# Patient Record
Sex: Female | Born: 2000
Health system: Southern US, Community
[De-identification: ages and names within clinical notes are randomized; demographics above are authoritative.]

## PROBLEM LIST (undated history)

## (undated) DIAGNOSIS — J45909 Unspecified asthma, uncomplicated: Secondary | ICD-10-CM

## (undated) HISTORY — PX: NO PAST SURGERIES: SHX2092

---
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
## 2021-05-11 ENCOUNTER — Other Ambulatory Visit: Payer: Self-pay

## 2021-05-11 ENCOUNTER — Ambulatory Visit
Admission: EM | Admit: 2021-05-11 | Discharge: 2021-05-11 | Disposition: A | Discharge status: Home / Self Care | Payer: Federal, State, Local not specified - PPO | Attending: Physician Assistant | Admitting: Physician Assistant

## 2021-05-11 ENCOUNTER — Encounter: Payer: Self-pay | Admitting: Emergency Medicine

## 2021-05-11 DIAGNOSIS — J069 Acute upper respiratory infection, unspecified: Secondary | ICD-10-CM | POA: Diagnosis present

## 2021-05-11 DIAGNOSIS — Z20822 Contact with and (suspected) exposure to covid-19: Secondary | ICD-10-CM | POA: Diagnosis not present

## 2021-05-11 MED ORDER — PROMETHAZINE-DM 6.25-15 MG/5ML PO SYRP: 5.0000 mL | ORAL_SOLUTION | Freq: Four times a day (QID) | ORAL | 0 refills | Status: AC | PRN

## 2021-05-11 NOTE — Discharge Instructions (Signed)

## 2021-05-11 NOTE — ED Triage Notes (Signed)
Pt presents today with dry cough x 5 days, denies fever.

## 2021-05-11 NOTE — ED Provider Notes (Signed)
[facility]    CSN: [id number] Arrival date & time: 05/11/21  1527      History   Chief Complaint Chief Complaint  Patient presents with   Cough    HPI Madison Hale is a 20 y.o. female presenting for 3 to 4-day history of cough and congestion.  Patient reports that her nasal congestion has improved.  Admits to scratchy throat.  Patient has history of asthma but denies any wheezing or breathing difficulty.  Notes mild chest tightness.  No associated fevers, fatigue or body aches.  No sick contacts or known exposure to influenza or COVID-19.  Patient has taken allergy medication and over-the-counter Mucinex.  Reports no improvement in symptoms.  Has not needed to use her asthma inhaler.  No other complaints.  HPI  Past Medical History:  Diagnosis Date   Asthma     There are no problems to display for this patient.   Past Surgical History:  Procedure Laterality Date   NO PAST SURGERIES      OB History   No obstetric history on file.      Home Medications    Prior to Admission medications   Medication Sig Start Date End Date Taking? Authorizing Provider  promethazine-dextromethorphan (PROMETHAZINE-DM) 6.25-15 MG/5ML syrup Take 5 mLs by mouth 4 (four) times daily as needed for cough. 05/11/21  Yes Eusebio Friendly B, PA-C  albuterol (PROVENTIL HFA;VENTOLIN HFA) 108 (90 BASE) MCG/ACT inhaler Inhale into the lungs every 6 (six) hours as needed for wheezing or shortness of breath.    [provider]  Dexmethylphenidate HCl (FOCALIN XR) 25 MG CP24 Take 25 mg by mouth daily.    [provider]  etonogestrel (NEXPLANON) 68 MG IMPL implant 68 mg by Subdermal route once.    [provider]  fluticasone-salmeterol (ADVAIR HFA) 115-21 MCG/ACT inhaler Inhale 2 puffs into the lungs 2 (two) times daily.    [provider]  montelukast (SINGULAIR) 10 MG tablet Take 10 mg by mouth at bedtime.    [provider]  traZODone  (DESYREL) 50 MG tablet Take 25 mg by mouth at bedtime as needed for sleep.    [provider]    Family History Family History  Adopted: Yes  Family history unknown: Yes    Social History Social History   Tobacco Use   Smoking status: Never   Smokeless tobacco: Never  Vaping Use   Vaping Use: Never used  Substance Use Topics   Alcohol use: No   Drug use: No     Allergies   Patient has no known allergies.   Review of Systems Review of Systems  Constitutional:  Negative for chills, diaphoresis, fatigue and fever.  HENT:  Positive for congestion. Negative for ear pain, rhinorrhea, sinus pressure, sinus pain and sore throat.   Respiratory:  Positive for cough and chest tightness. Negative for shortness of breath.   Cardiovascular:  Negative for chest pain.  Gastrointestinal:  Negative for abdominal pain, nausea and vomiting.  Musculoskeletal:  Negative for arthralgias and myalgias.  Skin:  Negative for rash.  Neurological:  Negative for weakness and headaches.  Hematological:  Negative for adenopathy.    Physical Exam Triage Vital Signs ED Triage Vitals  Enc Vitals Group     BP 05/11/21 1640 (!) 98/58     Pulse Rate 05/11/21 1640 78     Resp 05/11/21 1640 18     Temp 05/11/21 1640 98.6 F (37 C)  Temp Source 05/11/21 1640 Oral     SpO2 05/11/21 1640 100 %     Weight --      Height --      Head Circumference --      Peak Flow --      Pain Score 05/11/21 1638 0     Pain Loc --      Pain Edu? --      Excl. in GC? --    No data found.  Updated Vital Signs BP (!) 98/58 (BP Location: Right Arm)   Pulse 78   Temp 98.6 F (37 C) (Oral)   Resp 18   LMP 04/11/2021 (Exact Date)   SpO2 100%      Physical Exam Vitals and nursing note reviewed.  Constitutional:      General: She is not in acute distress.    Appearance: Normal appearance. She is not ill-appearing or toxic-appearing.  HENT:     Head: Normocephalic and atraumatic.     Nose:  Congestion present.     Mouth/Throat:     Mouth: Mucous membranes are moist.     Pharynx: Oropharynx is clear.  Eyes:     General: No scleral icterus.       Right eye: No discharge.        Left eye: No discharge.     Conjunctiva/sclera: Conjunctivae normal.  Cardiovascular:     Rate and Rhythm: Normal rate and regular rhythm.     Heart sounds: Normal heart sounds.  Pulmonary:     Effort: Pulmonary effort is normal. No respiratory distress.     Breath sounds: Normal breath sounds.  Musculoskeletal:     Cervical back: Neck supple.  Skin:    General: Skin is dry.  Neurological:     General: No focal deficit present.     Mental Status: She is alert. Mental status is at baseline.     Motor: No weakness.     Gait: Gait normal.  Psychiatric:        Mood and Affect: Mood normal.        Behavior: Behavior normal.        Thought Content: Thought content normal.     UC Treatments / Results  Labs (all labs ordered are listed, but only abnormal results are displayed) Labs Reviewed  SARS CORONAVIRUS 2 (TAT 6-24 HRS)    EKG   Radiology No results found.  Procedures Procedures (including critical care time)  Medications Ordered in UC Medications - No data to display  Initial Impression / Assessment and Plan / UC Course  I have reviewed the triage vital signs and the nursing notes.  Pertinent labs & imaging results that were available during my care of the patient were reviewed by me and considered in my medical decision making (see chart for details).   20 year old female presenting for 3 to 4-day history of cough and congestion.  Home COVID test negative today.  No associated fevers or breathing difficulty but admits to mild chest tightness.  History of asthma.  Has not needed to use inhaler.  Vitals are stable and she is overall well-appearing.  Exam significant for nasal congestion only.  Chest clear to auscultation heart regular rate and rhythm.  PCR COVID test  obtained.  Current CDC guidelines, isolation protocol and ED precautions reviewed if positive COVID.  Supportive care advised increasing rest of fluids.  Sent Promethazine DM to pharmacy.  Advised symptoms consistent with viral illness.  Advised using albuterol  if any shortness of breath.  Reviewed return ED precautions.  Work note given.   Final Clinical Impressions(s) / UC Diagnoses   Final diagnoses:  Viral URI with cough     Discharge Instructions      URI/COLD SYMPTOMS: Your exam today is consistent with a viral illness. Antibiotics are not indicated at this time. Use medications as directed, including cough syrup, nasal saline, and decongestants. Your symptoms should improve over the next few days and resolve within 7-10 days. Increase rest and fluids. F/u if symptoms worsen or predominate such as sore throat, ear pain, productive cough, shortness of breath, or if you develop high fevers or worsening fatigue over the next several days.    You have received COVID testing today either for positive exposure, concerning symptoms that could be related to COVID infection, screening purposes, or re-testing after confirmed positive.  Your test obtained today checks for active viral infection in the last 1-2 weeks. If your test is negative now, you can still test positive later. So, if you do develop symptoms you should either get re-tested and/or isolate x 5 days and then strict mask use x 5 days (unvaccinated) or mask use x 10 days (vaccinated). Please follow CDC guidelines.  While Rapid antigen tests come back in 15-20 minutes, send out PCR/molecular test results typically come back within 1-3 days. In the mean time, if you are symptomatic, assume this could be a positive test and treat/monitor yourself as if you do have COVID.   We will call with test results if positive. Please download the MyChart app and set up a profile to access test results.   If symptomatic, go home and rest. Push  fluids. Take Tylenol as needed for discomfort. Gargle warm salt water. Throat lozenges. Take Mucinex DM or Robitussin for cough. Humidifier in bedroom to ease coughing. Warm showers. Also review the COVID handout for more information.  COVID-19 INFECTION: The incubation period of COVID-19 is approximately 14 days after exposure, with most symptoms developing in roughly 4-5 days. Symptoms may range in severity from mild to critically severe. Roughly 80% of those infected will have mild symptoms. People of any age may become infected with COVID-19 and have the ability to transmit the virus. The most common symptoms include: fever, fatigue, cough, body aches, headaches, sore throat, nasal congestion, shortness of breath, nausea, vomiting, diarrhea, changes in smell and/or taste.    COURSE OF ILLNESS Some patients may begin with mild disease which can progress quickly into critical symptoms. If your symptoms are worsening please call ahead to the Emergency Department and proceed there for further treatment. Recovery time appears to be roughly 1-2 weeks for mild symptoms and 3-6 weeks for severe disease.   GO IMMEDIATELY TO ER FOR FEVER YOU ARE UNABLE TO GET DOWN WITH TYLENOL, BREATHING PROBLEMS, CHEST PAIN, FATIGUE, LETHARGY, INABILITY TO EAT OR DRINK, ETC  QUARANTINE AND ISOLATION: To help decrease the spread of COVID-19 please remain isolated if you have COVID infection or are highly suspected to have COVID infection. This means -stay home and isolate to one room in the home if you live with others. Do not share a bed or bathroom with others while ill, sanitize and wipe down all countertops and keep common areas clean and disinfected. Stay home for 5 days. If you have no symptoms or your symptoms are resolving after 5 days, you can leave your house. Continue to wear a mask around others for 5 additional days. If you have been  in close contact (within 6 feet) of someone diagnosed with COVID 19, you are  advised to quarantine in your home for 14 days as symptoms can develop anywhere from 2-14 days after exposure to the virus. If you develop symptoms, you  must isolate.  Most current guidelines for COVID after exposure -unvaccinated: isolate 5 days and strict mask use x 5 days. Test on day 5 is possible -vaccinated: wear mask x 10 days if symptoms do not develop -You do not necessarily need to be tested for COVID if you have + exposure and  develop symptoms. Just isolate at home x10 days from symptom onset During this global pandemic, CDC advises to practice social distancing, try to stay at least 57ft away from others at all times. Wear a face covering. Wash and sanitize your hands regularly and avoid going anywhere that is not necessary.  KEEP IN MIND THAT THE COVID TEST IS NOT 100% ACCURATE AND YOU SHOULD STILL DO EVERYTHING TO PREVENT POTENTIAL SPREAD OF VIRUS TO OTHERS (WEAR MASK, WEAR GLOVES, WASH HANDS AND SANITIZE REGULARLY). IF INITIAL TEST IS NEGATIVE, THIS MAY NOT MEAN YOU ARE DEFINITELY NEGATIVE. MOST ACCURATE TESTING IS DONE 5-7 DAYS AFTER EXPOSURE.   It is not advised by CDC to get re-tested after receiving a positive COVID test since you can still test positive for weeks to months after you have already cleared the virus.   *If you have not been vaccinated for COVID, I strongly suggest you consider getting vaccinated as long as there are no contraindications.       ED Prescriptions     Medication Sig Dispense Auth. Provider   promethazine-dextromethorphan (PROMETHAZINE-DM) 6.25-15 MG/5ML syrup Take 5 mLs by mouth 4 (four) times daily as needed for cough. 118 mL Shirlee Latch, PA-C      PDMP not reviewed this encounter.   Shirlee Latch, PA-C 05/11/21 1745

## 2021-05-12 LAB — SARS CORONAVIRUS 2 (TAT 6-24 HRS): SARS Coronavirus 2: NEGATIVE

## 2021-08-06 ENCOUNTER — Ambulatory Visit: Admit: 2021-08-06 | Payer: Self-pay

## 2021-08-06 ENCOUNTER — Ambulatory Visit
Admission: EM | Admit: 2021-08-06 | Discharge: 2021-08-06 | Disposition: A | Discharge status: Home / Self Care | Payer: Medicaid Other | Attending: Internal Medicine | Admitting: Internal Medicine

## 2021-08-06 ENCOUNTER — Other Ambulatory Visit: Payer: Self-pay

## 2021-08-06 DIAGNOSIS — R067 Sneezing: Secondary | ICD-10-CM | POA: Insufficient documentation

## 2021-08-06 DIAGNOSIS — R0981 Nasal congestion: Secondary | ICD-10-CM | POA: Diagnosis not present

## 2021-08-06 DIAGNOSIS — B9789 Other viral agents as the cause of diseases classified elsewhere: Secondary | ICD-10-CM | POA: Diagnosis not present

## 2021-08-06 DIAGNOSIS — J029 Acute pharyngitis, unspecified: Secondary | ICD-10-CM | POA: Diagnosis not present

## 2021-08-06 DIAGNOSIS — Z20822 Contact with and (suspected) exposure to covid-19: Secondary | ICD-10-CM | POA: Diagnosis not present

## 2021-08-06 DIAGNOSIS — J028 Acute pharyngitis due to other specified organisms: Secondary | ICD-10-CM | POA: Diagnosis not present

## 2021-08-06 DIAGNOSIS — R059 Cough, unspecified: Secondary | ICD-10-CM | POA: Insufficient documentation

## 2021-08-06 LAB — GROUP A STREP BY PCR: Group A Strep by PCR: NOT DETECTED

## 2021-08-06 NOTE — ED Provider Notes (Signed)
[facility]    CSN: [id number] Arrival date & time: 08/06/21  1631      History   Chief Complaint Chief Complaint  Patient presents with   Sore Throat    HPI Madison Hale is a 21 y.o. female who presents with ST onset since yesterday. Has stuffy nose and occasional cough. Denies fever, HA or body aches. But has been tired today and had had decreased appetite. Has been sneezing. Had in home covid test negative.     Past Medical History:  Diagnosis Date   Asthma     There are no problems to display for this patient.   Past Surgical History:  Procedure Laterality Date   NO PAST SURGERIES      OB History   No obstetric history on file.      Home Medications    Prior to Admission medications   Medication Sig Start Date End Date Taking? Authorizing Provider  albuterol (PROVENTIL HFA;VENTOLIN HFA) 108 (90 BASE) MCG/ACT inhaler Inhale into the lungs every 6 (six) hours as needed for wheezing or shortness of breath.   Yes [provider]  cetirizine (ZYRTEC) 10 MG tablet Take 1 tablet by mouth daily. 08/03/21  Yes [provider]  etonogestrel (NEXPLANON) 68 MG IMPL implant 68 mg by Subdermal route once.   Yes [provider]  fluticasone-salmeterol (ADVAIR HFA) 115-21 MCG/ACT inhaler Inhale 2 puffs into the lungs 2 (two) times daily.   Yes [provider]  VYVANSE 30 MG capsule Take 30 mg by mouth every morning. 07/08/21  Yes [provider]  Dexmethylphenidate HCl (FOCALIN XR) 25 MG CP24 Take 25 mg by mouth daily.    [provider]  montelukast (SINGULAIR) 10 MG tablet Take 10 mg by mouth at bedtime.    [provider]  promethazine-dextromethorphan (PROMETHAZINE-DM) 6.25-15 MG/5ML syrup Take 5 mLs by mouth 4 (four) times daily as needed for cough. 05/11/21   Shirlee Latch, PA-C  traZODone (DESYREL) 50 MG tablet Take 25 mg by mouth at bedtime as needed for sleep.    [provider]     Family History Family History  Adopted: Yes  Family history unknown: Yes    Social History Social History   Tobacco Use   Smoking status: Never   Smokeless tobacco: Never  Vaping Use   Vaping Use: Never used  Substance Use Topics   Alcohol use: No   Drug use: No     Allergies   Other   Review of Systems Review of Systems  Constitutional:  Positive for appetite change and fatigue. Negative for activity change, chills, diaphoresis and fever.  HENT:  Positive for congestion and sneezing. Negative for ear discharge and ear pain.   Respiratory:  Positive for cough.   Musculoskeletal:  Negative for myalgias.  Neurological:  Negative for headaches.    Physical Exam Triage Vital Signs ED Triage Vitals  Enc Vitals Group     BP 08/06/21 1652 127/65     Pulse Rate 08/06/21 1652 81     Resp 08/06/21 1652 18     Temp 08/06/21 1652 99.1 F (37.3 C)     Temp Source 08/06/21 1652 Oral     SpO2 08/06/21 1652 100 %     Weight 08/06/21 1651 130 lb (59 kg)     Height 08/06/21 1651 5' (1.524 m)     Head Circumference --      Peak Flow --  Pain Score 08/06/21 1651 2     Pain Loc --      Pain Edu? --      Excl. in GC? --    No data found.  Updated Vital Signs BP 127/65 (BP Location: Left Arm)    Pulse 81    Temp 99.1 F (37.3 C) (Oral)    Resp 18    Ht 5' (1.524 m)    Wt 130 lb (59 kg)    LMP  (LMP Unknown)    SpO2 100%    BMI 25.39 kg/m   Visual Acuity Right Eye Distance:   Left Eye Distance:   Bilateral Distance:    Right Eye Near:   Left Eye Near:    Bilateral Near:     Physical Exam Physical Exam Vitals signs and nursing note reviewed.  Constitutional:      General: She is not in acute distress.    Appearance: Normal appearance. She is not ill-appearing, toxic-appearing or diaphoretic.  HENT:     Head: Normocephalic.     Right Ear: Tympanic membrane, ear canal and external ear normal.     Left Ear: Tympanic membrane, ear canal and external ear  normal.     Nose: with moderate swelling on the R, and mild on the L. Has clear mucous.     Mouth/Throat:     Mouth: Mucous membranes are moist.  Eyes:     General: No scleral icterus.       Right eye: No discharge.        Left eye: No discharge.     Conjunctiva/sclera: Conjunctivae normal.  Neck:     Musculoskeletal: Neck supple. No neck rigidity.  Cardiovascular:     Rate and Rhythm: Normal rate and regular rhythm.     Heart sounds: No murmur.  Pulmonary:     Effort: Pulmonary effort is normal.     Breath sounds: Normal breath sounds.  Musculoskeletal: Normal range of motion.  Lymphadenopathy:     Cervical: No cervical adenopathy.  Skin:    General: Skin is warm and dry.     Coloration: Skin is not jaundiced.     Findings: No rash.  Neurological:     Mental Status: She is alert and oriented to person, place, and time.     Gait: Gait normal.  Psychiatric:        Mood and Affect: Mood normal.        Behavior: Behavior normal.        Thought Content: Thought content normal.        Judgment: Judgment normal.    UC Treatments / Results  Labs (all labs ordered are listed, but only abnormal results are displayed) Labs Reviewed  GROUP A STREP BY PCR  SARS CORONAVIRUS 2 (TAT 6-24 HRS)  Strep PCR is neg.   EKG   Radiology No results found.  Procedures Procedures (including critical care time)  Medications Ordered in UC Medications - No data to display  Initial Impression / Assessment and Plan / UC Course  I have reviewed the triage vital signs and the nursing notes. Pertinent labs  results that were available during my care of the patient were reviewed by me and considered in my medical decision making (see chart for details). Viral pharyngitis Covid test pending and we will call her if positive See instructions.    Final Clinical Impressions(s) / UC Diagnoses   Final diagnoses:  Viral pharyngitis     Discharge Instructions  Your strep test is  negative. You have a virus causing your sore throat We will call you if the Covid test turns positive You may use any over the counter medication like throat sprays and cough drops to sooth your sore throat.      ED Prescriptions   None    PDMP not reviewed this encounter.   Garey Ham, Cordelia Poche 08/06/21 1858

## 2021-08-06 NOTE — Discharge Instructions (Signed)
Your strep test is negative. You have a virus causing your sore throat We will call you if the Covid test turns positive You may use any over the counter medication like throat sprays and cough drops to sooth your sore throat.

## 2021-08-06 NOTE — ED Triage Notes (Signed)
Patient is here for "s/t". Started yesterday. No fever. No new/unexplained rash. Some intermittent runny nose at times "only with sneezing".

## 2021-08-07 LAB — SARS CORONAVIRUS 2 (TAT 6-24 HRS): SARS Coronavirus 2: NEGATIVE
